# Patient Record
Sex: Male | Born: 1935 | Race: White | Hispanic: No | Marital: Married | Smoking: Never smoker
Health system: Southern US, Community
[De-identification: ages and names within clinical notes are randomized; demographics above are authoritative.]

## PROBLEM LIST (undated history)

## (undated) DIAGNOSIS — M1990 Unspecified osteoarthritis, unspecified site: Secondary | ICD-10-CM

## (undated) DIAGNOSIS — R42 Dizziness and giddiness: Secondary | ICD-10-CM

## (undated) DIAGNOSIS — I440 Atrioventricular block, first degree: Secondary | ICD-10-CM

## (undated) DIAGNOSIS — D696 Thrombocytopenia, unspecified: Secondary | ICD-10-CM

## (undated) DIAGNOSIS — E079 Disorder of thyroid, unspecified: Secondary | ICD-10-CM

## (undated) DIAGNOSIS — H9190 Unspecified hearing loss, unspecified ear: Secondary | ICD-10-CM

## (undated) DIAGNOSIS — D469 Myelodysplastic syndrome, unspecified: Secondary | ICD-10-CM

## (undated) DIAGNOSIS — F320 Major depressive disorder, single episode, mild: Secondary | ICD-10-CM

## (undated) DIAGNOSIS — K5790 Diverticulosis of intestine, part unspecified, without perforation or abscess without bleeding: Secondary | ICD-10-CM

## (undated) DIAGNOSIS — Z8719 Personal history of other diseases of the digestive system: Secondary | ICD-10-CM

## (undated) DIAGNOSIS — F32A Depression, unspecified: Secondary | ICD-10-CM

## (undated) DIAGNOSIS — A048 Other specified bacterial intestinal infections: Secondary | ICD-10-CM

## (undated) DIAGNOSIS — Z79620 Long term (current) use of immunosuppressive biologic: Secondary | ICD-10-CM

## (undated) DIAGNOSIS — L570 Actinic keratosis: Secondary | ICD-10-CM

## (undated) DIAGNOSIS — C4490 Unspecified malignant neoplasm of skin, unspecified: Secondary | ICD-10-CM

## (undated) DIAGNOSIS — Z79899 Other long term (current) drug therapy: Secondary | ICD-10-CM

## (undated) DIAGNOSIS — D649 Anemia, unspecified: Secondary | ICD-10-CM

## (undated) HISTORY — DX: Diverticulosis of intestine, part unspecified, without perforation or abscess without bleeding: K57.90

## (undated) HISTORY — DX: Unspecified hearing loss, unspecified ear: H91.90

## (undated) HISTORY — PX: APPENDECTOMY: SHX54

## (undated) HISTORY — DX: Anemia, unspecified: D64.9

## (undated) HISTORY — DX: Other specified bacterial intestinal infections: A04.8

## (undated) HISTORY — DX: Unspecified osteoarthritis, unspecified site: M19.90

## (undated) HISTORY — DX: Actinic keratosis: L57.0

## (undated) HISTORY — DX: Myelodysplastic syndrome, unspecified: D46.9

## (undated) HISTORY — PX: ANTERIOR FUSION CERVICAL SPINE: SUR626

## (undated) HISTORY — PX: KNEE ARTHROSCOPY: SHX127

## (undated) HISTORY — DX: Long term (current) use of immunosuppressive biologic: Z79.620

## (undated) HISTORY — DX: Atrioventricular block, first degree: I44.0

## (undated) HISTORY — DX: Major depressive disorder, single episode, mild: F32.0

## (undated) HISTORY — DX: Other long term (current) drug therapy: Z79.899

## (undated) HISTORY — DX: Depression, unspecified: F32.A

## (undated) HISTORY — DX: Dizziness and giddiness: R42

## (undated) HISTORY — DX: Personal history of other diseases of the digestive system: Z87.19

## (undated) HISTORY — PX: HIP FRACTURE SURGERY: SHX118

## (undated) HISTORY — DX: Disorder of thyroid, unspecified: E07.9

## (undated) HISTORY — DX: Thrombocytopenia, unspecified: D69.6

## (undated) HISTORY — DX: Unspecified malignant neoplasm of skin, unspecified: C44.90

---

## 2005-04-12 ENCOUNTER — Ambulatory Visit: Payer: Self-pay | Admitting: Family Medicine

## 2005-07-17 ENCOUNTER — Ambulatory Visit: Payer: Self-pay | Admitting: Neurology

## 2005-09-04 ENCOUNTER — Ambulatory Visit: Payer: Self-pay | Admitting: Unknown Physician Specialty

## 2005-10-05 ENCOUNTER — Other Ambulatory Visit: Payer: Self-pay

## 2005-10-19 ENCOUNTER — Inpatient Hospital Stay: Payer: Self-pay | Admitting: General Practice

## 2006-01-10 ENCOUNTER — Emergency Department: Payer: Self-pay | Admitting: Emergency Medicine

## 2006-01-15 ENCOUNTER — Emergency Department: Payer: Self-pay | Admitting: Emergency Medicine

## 2006-06-19 ENCOUNTER — Ambulatory Visit: Payer: Self-pay | Admitting: Ophthalmology

## 2006-07-31 ENCOUNTER — Ambulatory Visit: Payer: Self-pay | Admitting: Ophthalmology

## 2008-01-21 ENCOUNTER — Inpatient Hospital Stay: Payer: Self-pay | Admitting: *Deleted

## 2008-10-18 ENCOUNTER — Ambulatory Visit: Payer: Self-pay | Admitting: Family Medicine

## 2009-09-10 ENCOUNTER — Inpatient Hospital Stay: Payer: Self-pay | Admitting: Orthopedic Surgery

## 2009-10-14 ENCOUNTER — Emergency Department: Payer: Self-pay | Admitting: Emergency Medicine

## 2010-04-14 ENCOUNTER — Encounter: Payer: Self-pay | Admitting: Family Medicine

## 2010-05-11 ENCOUNTER — Encounter: Payer: Self-pay | Admitting: Family Medicine

## 2010-06-10 ENCOUNTER — Encounter: Payer: Self-pay | Admitting: Family Medicine

## 2011-02-26 ENCOUNTER — Ambulatory Visit: Payer: Self-pay | Admitting: General Practice

## 2011-03-12 ENCOUNTER — Inpatient Hospital Stay: Payer: Self-pay | Admitting: General Practice

## 2011-03-16 ENCOUNTER — Encounter: Payer: Self-pay | Admitting: Internal Medicine

## 2011-04-11 ENCOUNTER — Encounter: Payer: Self-pay | Admitting: Internal Medicine

## 2011-04-12 ENCOUNTER — Encounter: Payer: Self-pay | Admitting: Nurse Practitioner

## 2011-04-12 ENCOUNTER — Encounter: Payer: Self-pay | Admitting: Cardiothoracic Surgery

## 2011-05-12 ENCOUNTER — Encounter: Payer: Self-pay | Admitting: Nurse Practitioner

## 2011-05-12 ENCOUNTER — Encounter: Payer: Self-pay | Admitting: Cardiothoracic Surgery

## 2011-08-06 ENCOUNTER — Other Ambulatory Visit: Payer: Self-pay | Admitting: Family Medicine

## 2011-08-06 ENCOUNTER — Ambulatory Visit: Payer: Self-pay | Admitting: Family Medicine

## 2011-08-09 ENCOUNTER — Other Ambulatory Visit: Payer: Self-pay | Admitting: General Surgery

## 2012-01-29 ENCOUNTER — Ambulatory Visit: Payer: Self-pay | Admitting: Family Medicine

## 2012-06-04 ENCOUNTER — Emergency Department: Payer: Self-pay

## 2012-06-04 LAB — URINALYSIS, COMPLETE
Bacteria: NONE SEEN
Bilirubin,UR: NEGATIVE
Blood: NEGATIVE
Glucose,UR: NEGATIVE mg/dL (ref 0–75)
Ketone: NEGATIVE
Nitrite: NEGATIVE
Protein: NEGATIVE
RBC,UR: <1 /HPF (ref 0–5)
WBC UR: NONE SEEN /HPF (ref 0–5)

## 2012-06-04 LAB — TROPONIN I: Troponin-I: 0.03 ng/mL

## 2012-06-04 LAB — COMPREHENSIVE METABOLIC PANEL
Albumin: 3.5 g/dL (ref 3.4–5.0)
Alkaline Phosphatase: 187 U/L — ABNORMAL HIGH (ref 50–136)
Anion Gap: 8 (ref 7–16)
BUN: 14 mg/dL (ref 7–18)
Calcium, Total: 9.4 mg/dL (ref 8.5–10.1)
Glucose: 99 mg/dL (ref 65–99)
Osmolality: 287 (ref 275–301)
Potassium: 4.5 mmol/L (ref 3.5–5.1)
SGOT(AST): 35 U/L (ref 15–37)
SGPT (ALT): 42 U/L (ref 12–78)

## 2012-06-04 LAB — CBC
HCT: 40.6 % (ref 40.0–52.0)
MCHC: 34 g/dL (ref 32.0–36.0)
MCV: 102 fL — ABNORMAL HIGH (ref 80–100)
Platelet: 84 10*3/uL — ABNORMAL LOW (ref 150–440)
RDW: 14.2 % (ref 11.5–14.5)

## 2013-08-21 ENCOUNTER — Ambulatory Visit: Payer: Self-pay | Admitting: Podiatry

## 2013-08-27 ENCOUNTER — Encounter: Payer: Self-pay | Admitting: Podiatry

## 2013-08-28 ENCOUNTER — Encounter: Payer: Self-pay | Admitting: Podiatry

## 2013-08-28 ENCOUNTER — Ambulatory Visit (INDEPENDENT_AMBULATORY_CARE_PROVIDER_SITE_OTHER): Payer: Medicare Other | Admitting: Podiatry

## 2013-08-28 VITALS — BP 142/75 | HR 74 | Resp 18

## 2013-08-28 DIAGNOSIS — B351 Tinea unguium: Secondary | ICD-10-CM

## 2013-08-28 DIAGNOSIS — L84 Corns and callosities: Secondary | ICD-10-CM

## 2013-08-28 DIAGNOSIS — M79609 Pain in unspecified limb: Secondary | ICD-10-CM

## 2013-08-28 NOTE — Progress Notes (Signed)
Subjective:     Patient ID: Willie Mccarthy, male   DOB: 1922/07/07, 79 y.o.   MRN: [id number]  HPI presents with caregiver stating my nails are awful and I have a corn on my third toe right foot the nails are very painful   Review of Systems     Objective:   Physical Exam Neurovascular status intact with no health history changes in a noted thick painful nail bed 1-5 both feet and keratotic lesion third right    Assessment:     Chronic nail disease with pain 1-5 both feet and keratotic lesion third right    Plan:     Debridement of painful nails and lesion right foot with no bleeding

## 2013-08-28 NOTE — Progress Notes (Signed)
   Subjective:    Patient ID: Willie Mccarthy, male    DOB: [birth year], 79 y.o.   MRN: [id number]  HPI Comments: Trim toenails      Review of Systems     Objective:   Physical Exam        Assessment & Plan:

## 2013-09-17 ENCOUNTER — Ambulatory Visit: Payer: Self-pay | Admitting: Cardiovascular Disease

## 2013-09-23 IMAGING — CR DG CHEST 2V
1 series · 4 of 4 positions shown · non-contrast
Comparison: none

REASON FOR EXAM: left chest wall pain painful respirations
COMMENTS:

[Series 1: pa · 0.17mm/px · 4 of 4 slices shown]
[im 1/4]
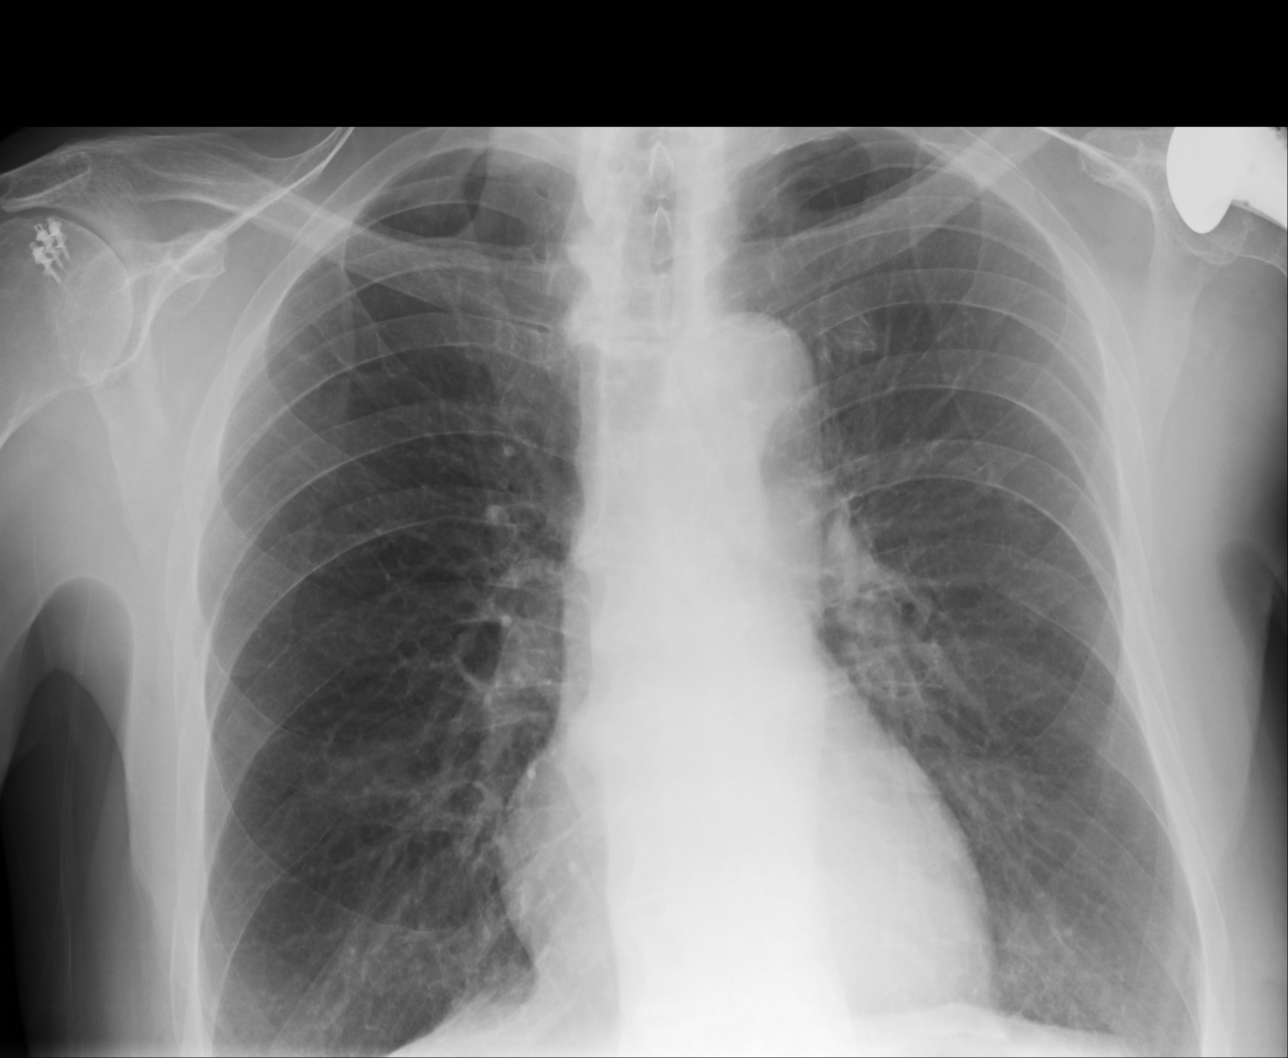
[im 2/4]
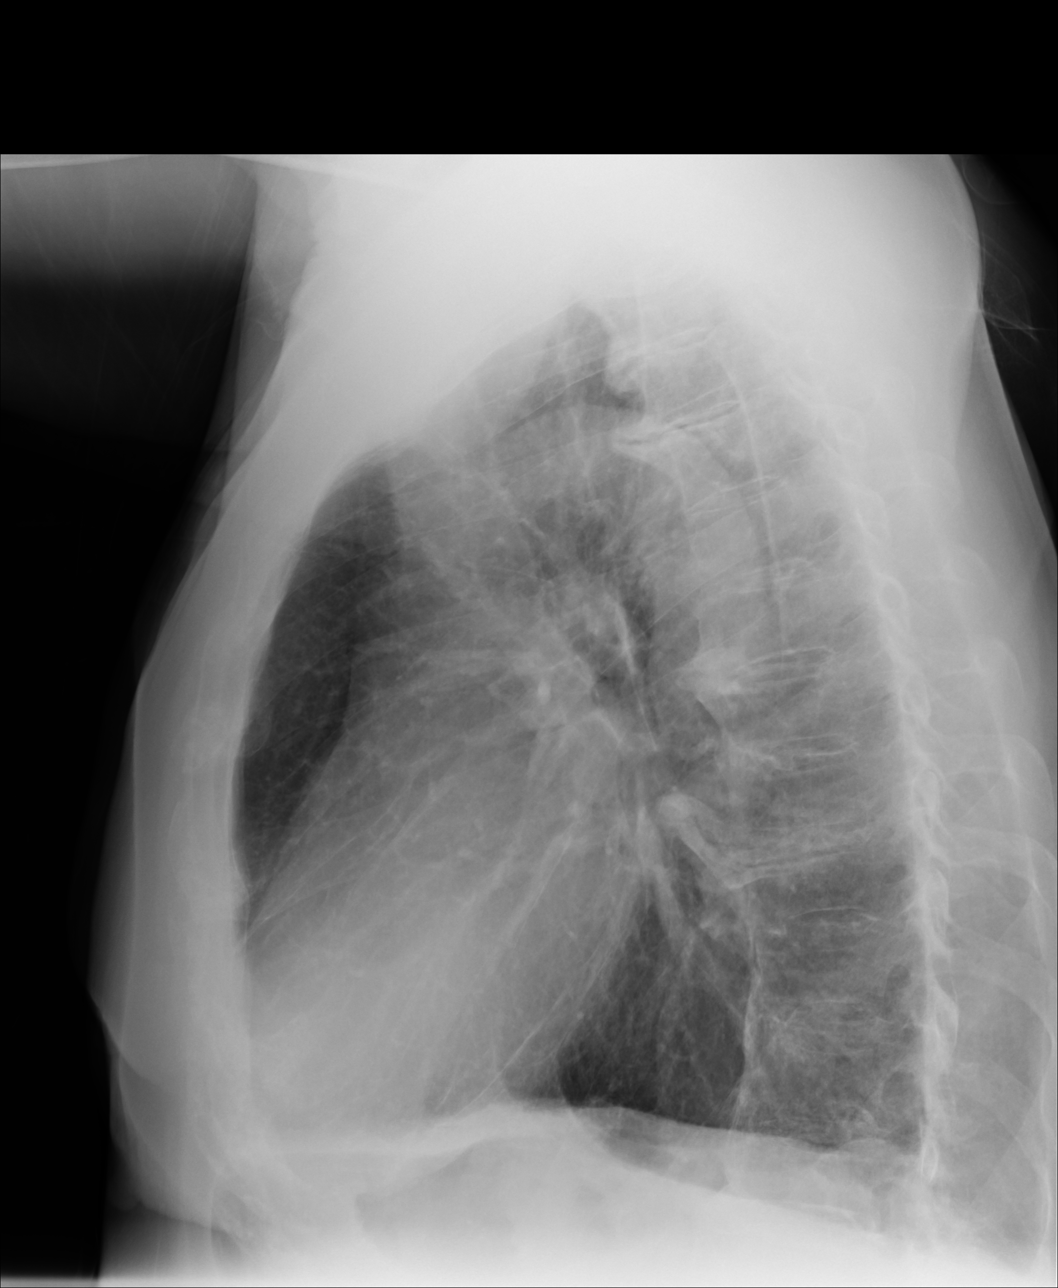
[im 3/4]
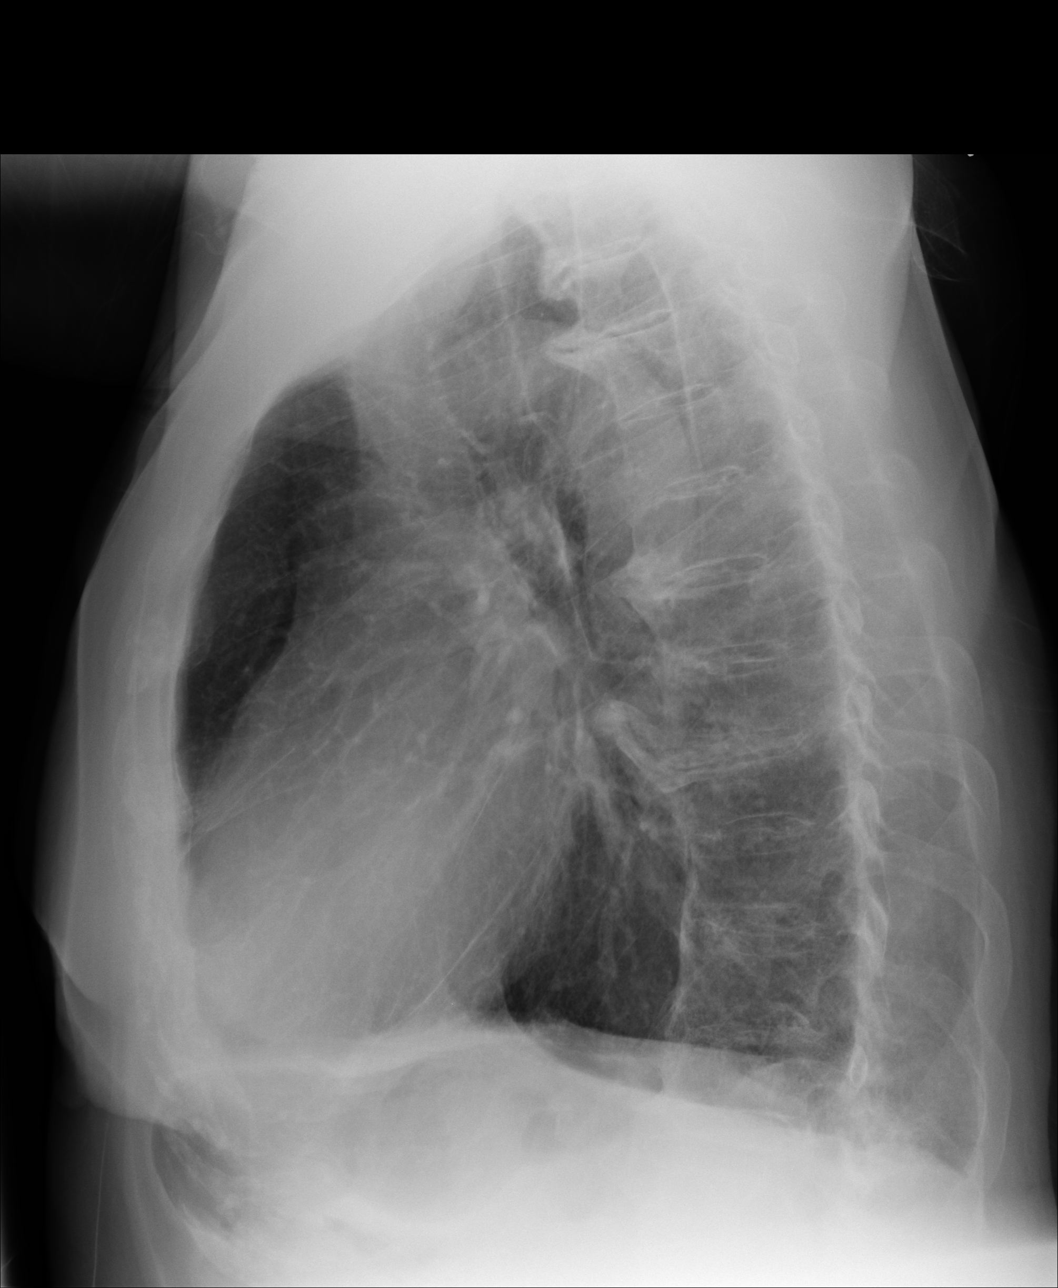
[im 4/4]
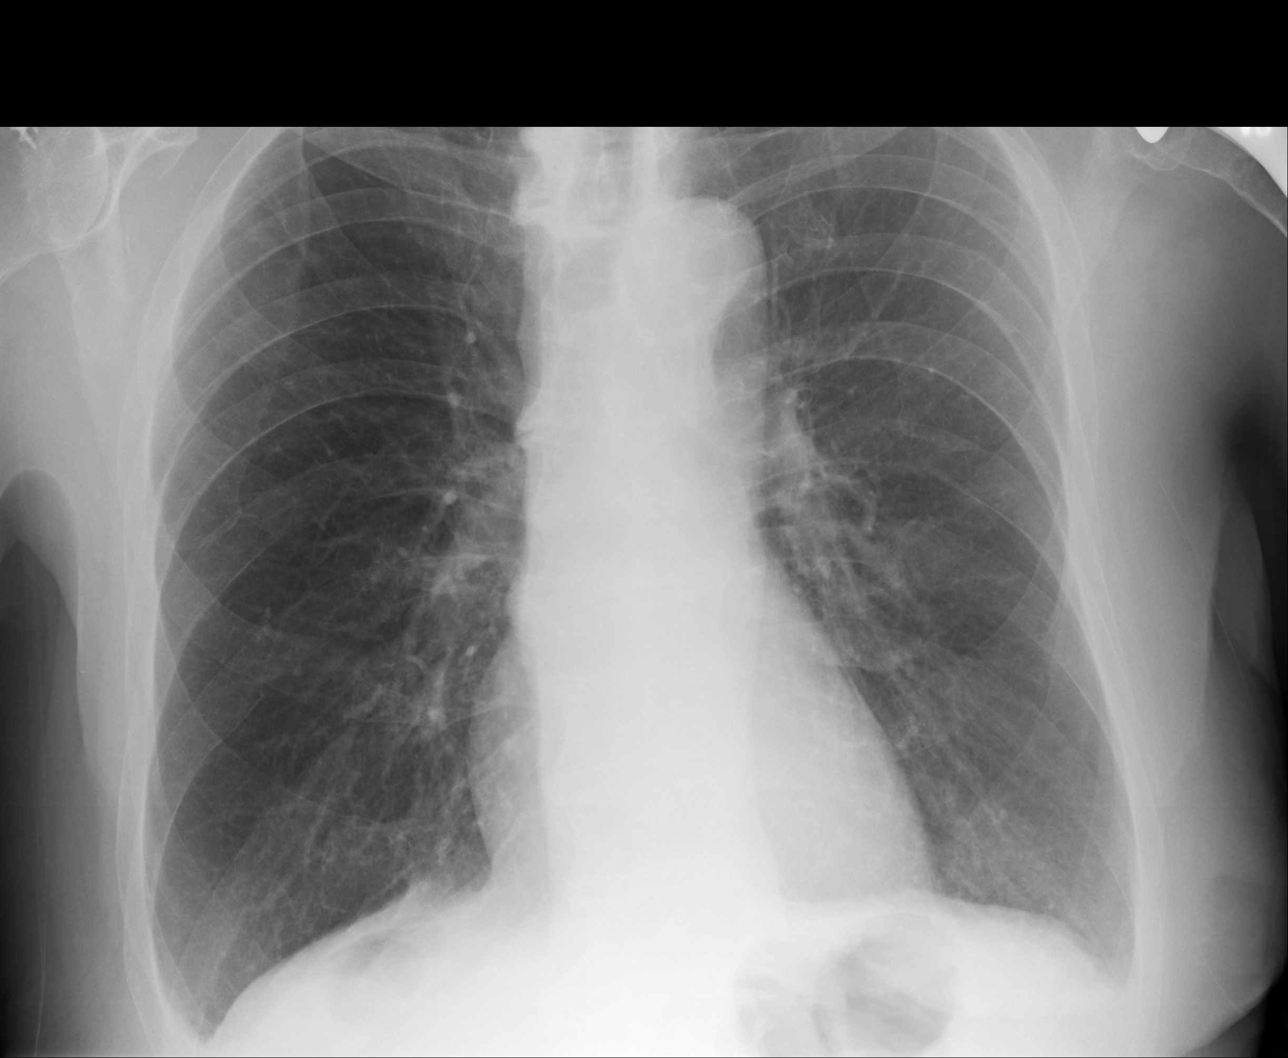

[4 of 4 positions shown; findings below may reference images not displayed]

PROCEDURE:     KDR - KDXR CHEST PA (OR AP) AND LAT  - January 29, 2012  [DATE]

RESULT:     Degenerative changes are seen in the spine. The lungs are
hyperinflated but appear to be clear of infiltrate. There is no edema,
effusion or pneumothorax. There is no discrete mass. There does not appear
to be significant interval change compared to the study 26 February, 2011.
IMPRESSION: 1. COPD.
2. Bony degenerative disease. No acute cardiopulmonary disease evident.

[REDACTED]

## 2013-09-23 IMAGING — CR DG RIBS 2V*L*
1 series · 4 of 4 positions shown · non-contrast
Comparison: none

REASON FOR EXAM: left chest wall pain painful respirations
COMMENTS:

PROCEDURE:     KDR - KDXR RIBS LEFT UNILATERAL  - January 29, 2012  [DATE]
RESULT:     Images of the left ribs demonstrate what appears to be fracture
in the anterior to anterolateral left second rib. No other additional bony
abnormality is seen other than osteopenia.

[Series 1: pa · 0.17mm/px · 4 of 4 slices shown]
[im 1/4]
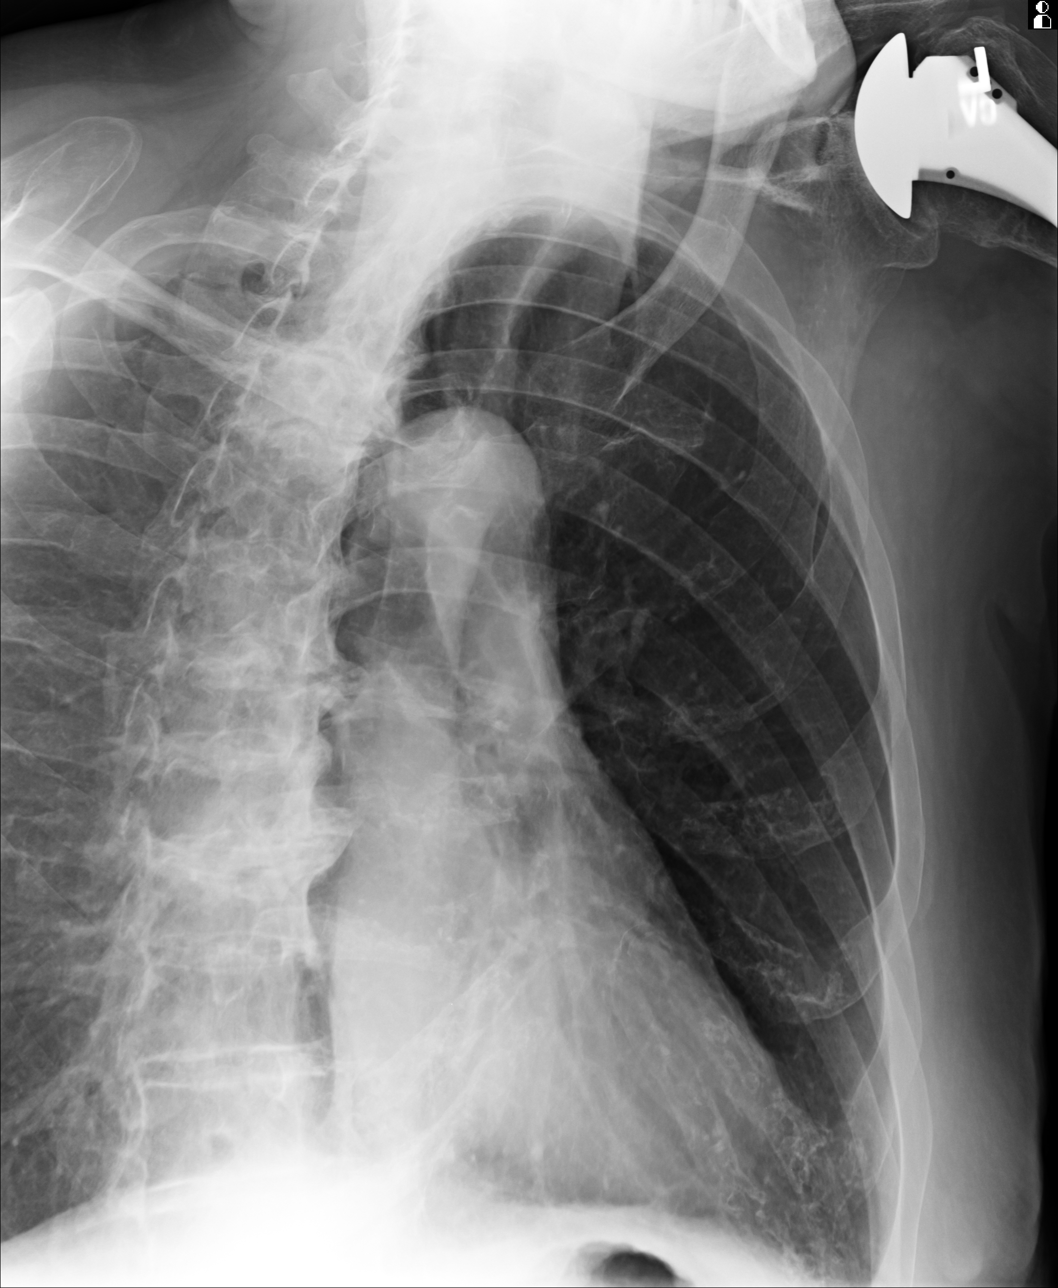
[im 2/4]
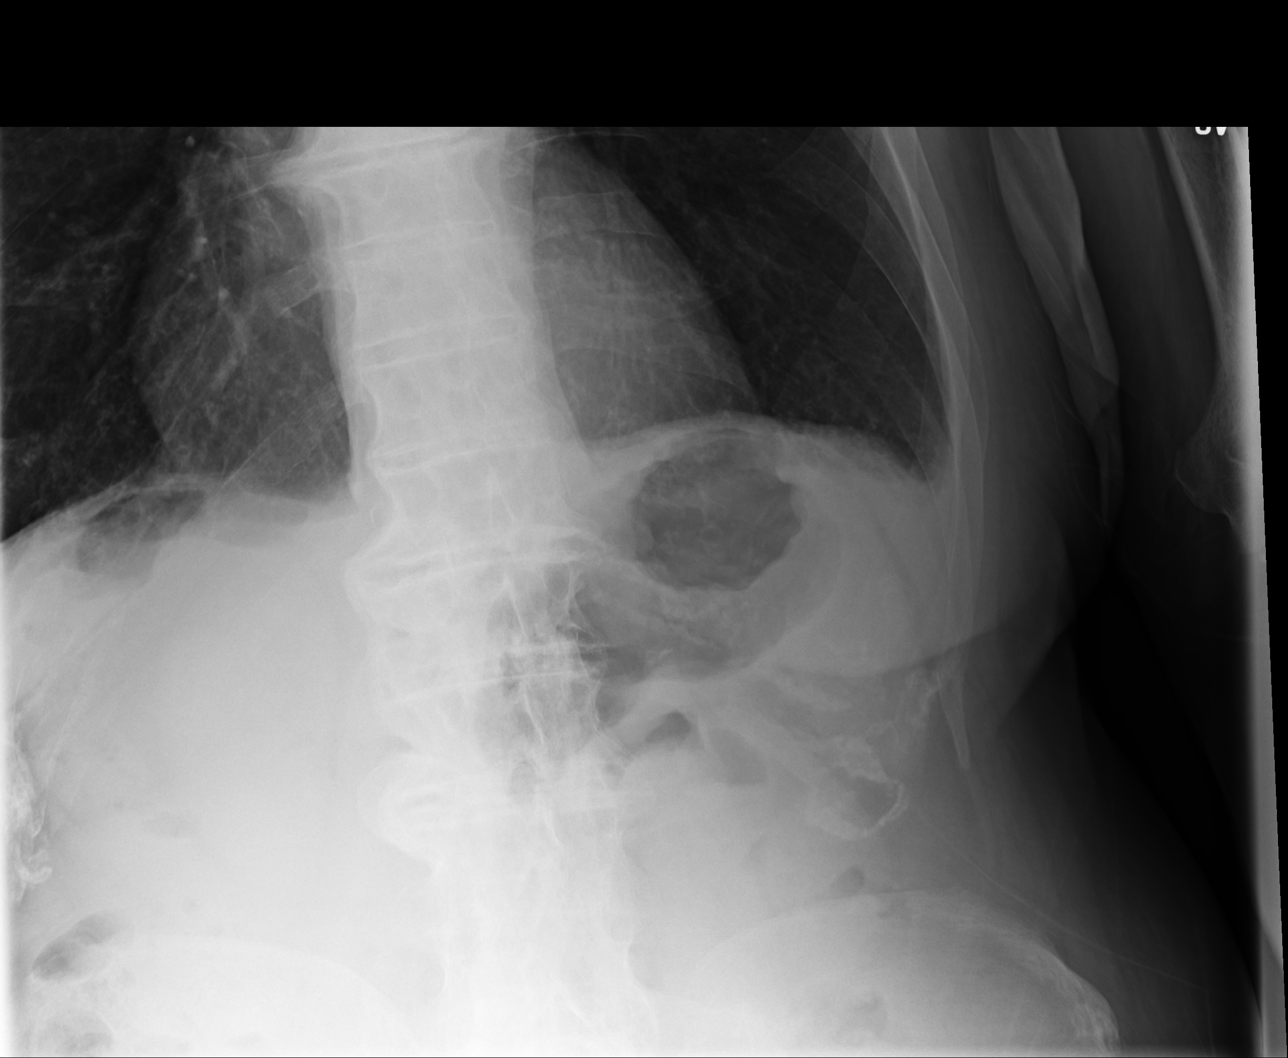
[im 3/4]
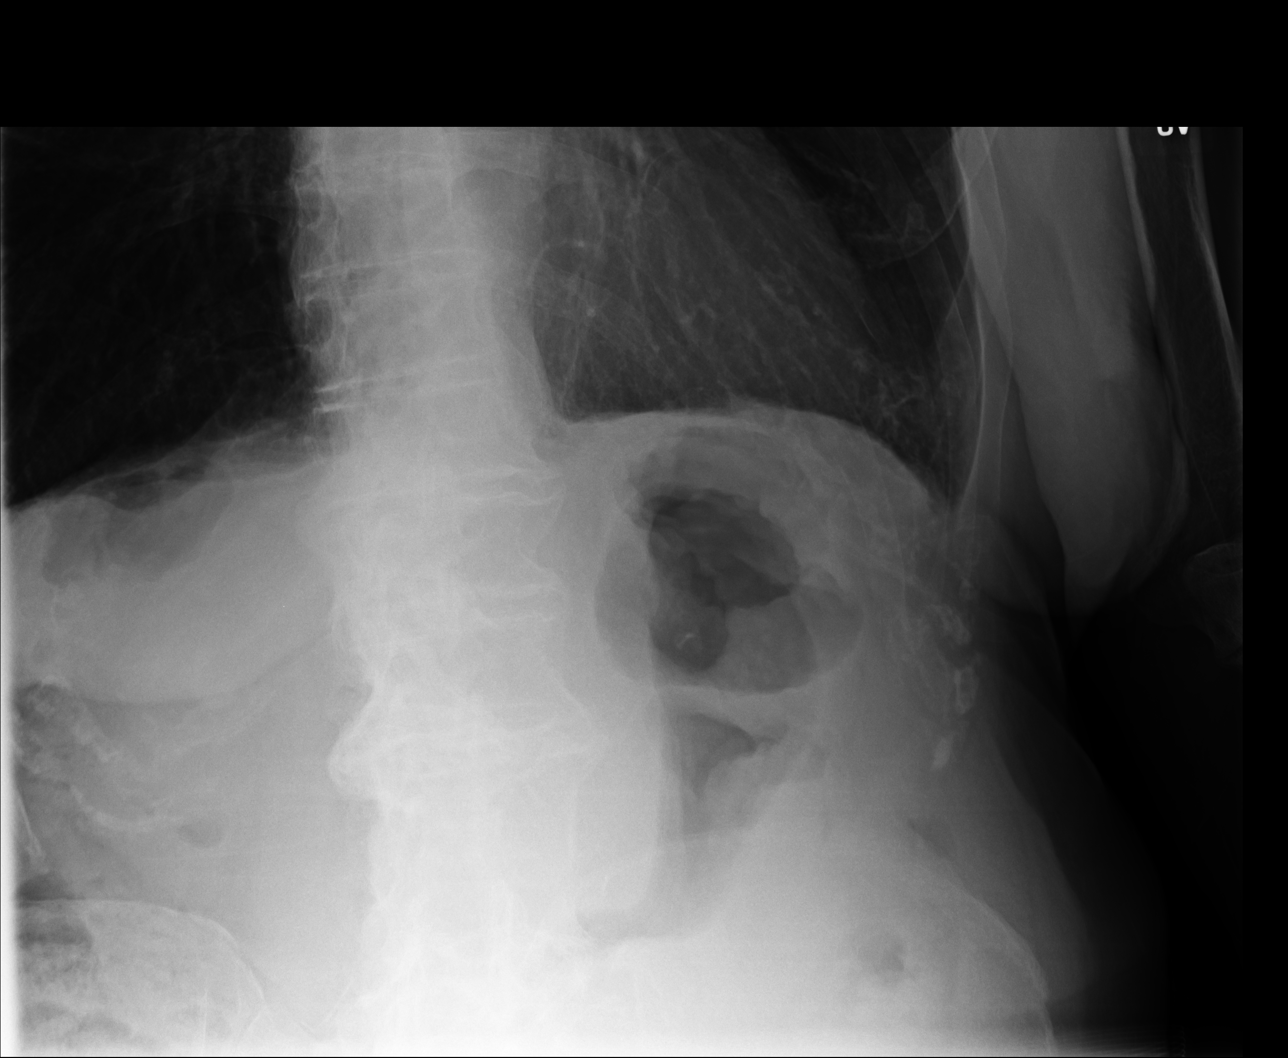
[im 4/4]
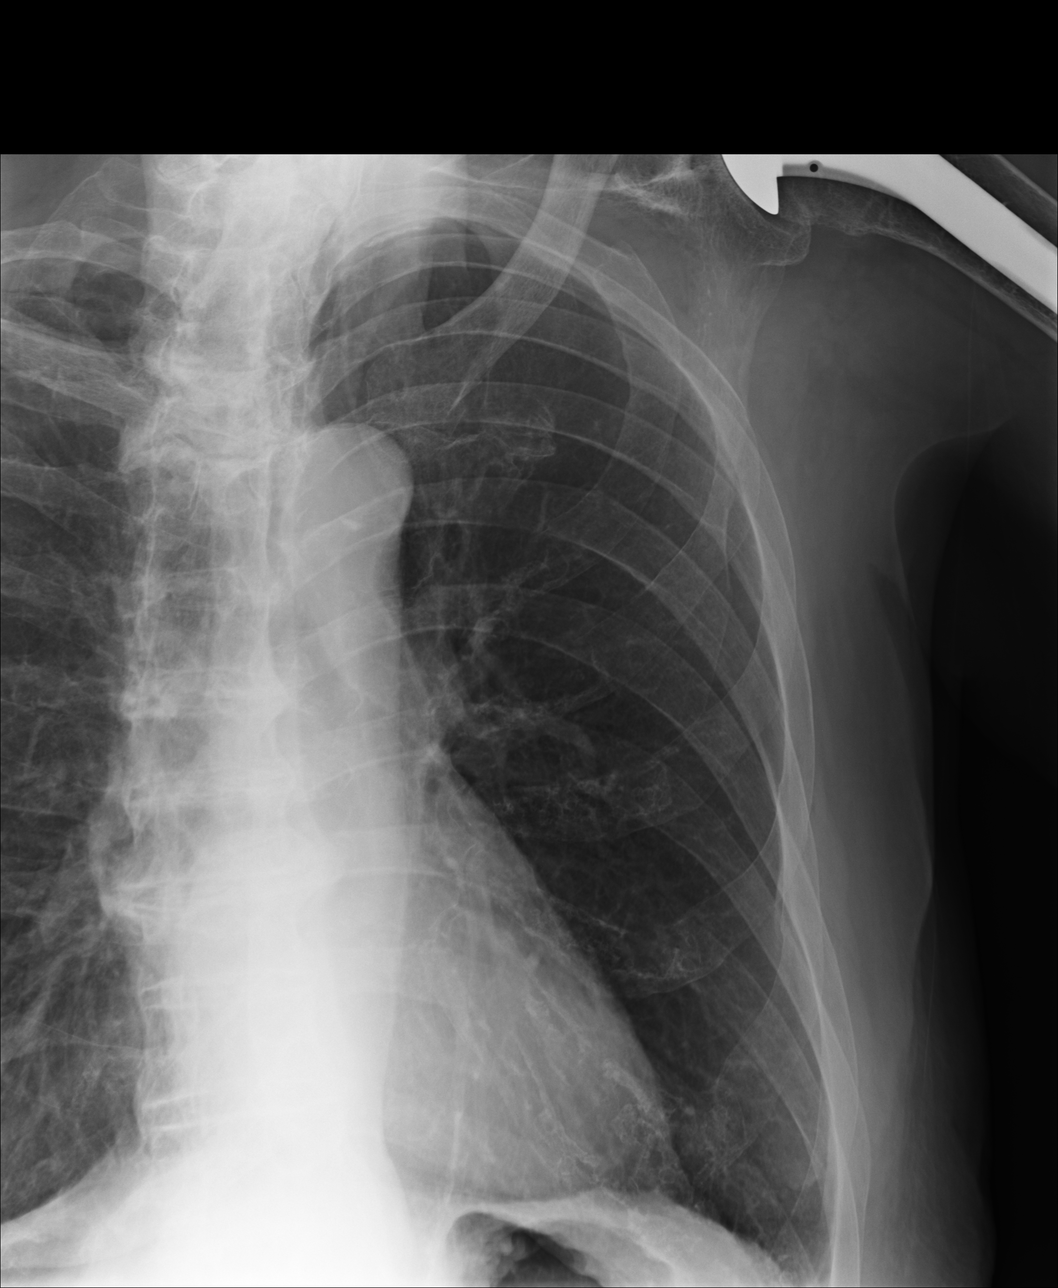

[4 of 4 positions shown; findings below may reference images not displayed]

IMPRESSION: Apparent isolated left second rib fracture. Correlate
clinically.

[REDACTED]

## 2013-09-24 ENCOUNTER — Ambulatory Visit: Payer: Self-pay | Admitting: Cardiovascular Disease

## 2013-10-02 ENCOUNTER — Inpatient Hospital Stay: Payer: Self-pay | Admitting: Internal Medicine

## 2013-10-02 LAB — URINALYSIS, COMPLETE
BILIRUBIN, UR: NEGATIVE
Bacteria: NONE SEEN
Blood: NEGATIVE
GLUCOSE, UR: NEGATIVE mg/dL (ref 0–75)
Ketone: NEGATIVE
LEUKOCYTE ESTERASE: NEGATIVE
NITRITE: NEGATIVE
Ph: 6 (ref 4.5–8.0)
Protein: NEGATIVE
RBC,UR: 1 /HPF (ref 0–5)
Specific Gravity: 1.01 (ref 1.003–1.030)
Squamous Epithelial: NONE SEEN

## 2013-10-02 LAB — BASIC METABOLIC PANEL
Anion Gap: 6 — ABNORMAL LOW (ref 7–16)
BUN: 56 mg/dL — ABNORMAL HIGH (ref 7–18)
CALCIUM: 9.5 mg/dL (ref 8.5–10.1)
CHLORIDE: 99 mmol/L (ref 98–107)
CO2: 37 mmol/L — AB (ref 21–32)
Creatinine: 1.58 mg/dL — ABNORMAL HIGH (ref 0.60–1.30)
EGFR (African American): 44 — ABNORMAL LOW
GFR CALC NON AF AMER: 38 — AB
GLUCOSE: 93 mg/dL (ref 65–99)
OSMOLALITY: 298 (ref 275–301)
POTASSIUM: 2.9 mmol/L — AB (ref 3.5–5.1)
SODIUM: 142 mmol/L (ref 136–145)

## 2013-10-02 LAB — CBC
HCT: 44.2 % (ref 40.0–52.0)
HGB: 14.7 g/dL (ref 13.0–18.0)
MCH: 33.5 pg (ref 26.0–34.0)
MCHC: 33.3 g/dL (ref 32.0–36.0)
MCV: 101 fL — AB (ref 80–100)
Platelet: 111 10*3/uL — ABNORMAL LOW (ref 150–440)
RBC: 4.39 10*6/uL — AB (ref 4.40–5.90)
RDW: 14.5 % (ref 11.5–14.5)
WBC: 7.6 10*3/uL (ref 3.8–10.6)

## 2013-10-02 LAB — TROPONIN I: Troponin-I: 0.03 ng/mL

## 2013-10-02 LAB — PRO B NATRIURETIC PEPTIDE: B-Type Natriuretic Peptide: 601 pg/mL — ABNORMAL HIGH (ref 0–450)

## 2013-10-03 LAB — BASIC METABOLIC PANEL
Anion Gap: 4 — ABNORMAL LOW (ref 7–16)
BUN: 50 mg/dL — ABNORMAL HIGH (ref 7–18)
CALCIUM: 8.7 mg/dL (ref 8.5–10.1)
Chloride: 104 mmol/L (ref 98–107)
Co2: 35 mmol/L — ABNORMAL HIGH (ref 21–32)
Creatinine: 1.32 mg/dL — ABNORMAL HIGH (ref 0.60–1.30)
EGFR (African American): 55 — ABNORMAL LOW
GFR CALC NON AF AMER: 47 — AB
Glucose: 87 mg/dL (ref 65–99)
OSMOLALITY: 298 (ref 275–301)
POTASSIUM: 3 mmol/L — AB (ref 3.5–5.1)
SODIUM: 143 mmol/L (ref 136–145)

## 2013-10-03 LAB — TSH: Thyroid Stimulating Horm: 1.98 u[IU]/mL

## 2013-10-03 LAB — MAGNESIUM: Magnesium: 2.3 mg/dL

## 2013-10-03 LAB — POTASSIUM: Potassium: 3.7 mmol/L (ref 3.5–5.1)

## 2013-10-04 ENCOUNTER — Ambulatory Visit: Payer: Self-pay | Admitting: Orthopedic Surgery

## 2013-10-04 LAB — CBC WITH DIFFERENTIAL/PLATELET
Basophil #: 0 10*3/uL (ref 0.0–0.1)
Basophil %: 0.4 %
Eosinophil #: 0.1 10*3/uL (ref 0.0–0.7)
Eosinophil %: 1.2 %
HCT: 36.7 % — ABNORMAL LOW (ref 40.0–52.0)
HGB: 12.2 g/dL — ABNORMAL LOW (ref 13.0–18.0)
LYMPHS ABS: 0.9 10*3/uL — AB (ref 1.0–3.6)
Lymphocyte %: 13.6 %
MCH: 33.7 pg (ref 26.0–34.0)
MCHC: 33.2 g/dL (ref 32.0–36.0)
MCV: 102 fL — ABNORMAL HIGH (ref 80–100)
MONO ABS: 0.5 x10 3/mm (ref 0.2–1.0)
Monocyte %: 7.7 %
NEUTROS ABS: 5.3 10*3/uL (ref 1.4–6.5)
Neutrophil %: 77.1 %
PLATELETS: 88 10*3/uL — AB (ref 150–440)
RBC: 3.61 10*6/uL — ABNORMAL LOW (ref 4.40–5.90)
RDW: 14.7 % — AB (ref 11.5–14.5)
WBC: 6.9 10*3/uL (ref 3.8–10.6)

## 2013-10-04 LAB — BASIC METABOLIC PANEL
ANION GAP: 5 — AB (ref 7–16)
BUN: 41 mg/dL — ABNORMAL HIGH (ref 7–18)
CALCIUM: 8.6 mg/dL (ref 8.5–10.1)
CHLORIDE: 110 mmol/L — AB (ref 98–107)
Co2: 27 mmol/L (ref 21–32)
Creatinine: 1.17 mg/dL (ref 0.60–1.30)
GFR CALC NON AF AMER: 55 — AB
Glucose: 91 mg/dL (ref 65–99)
Osmolality: 293 (ref 275–301)
POTASSIUM: 3.9 mmol/L (ref 3.5–5.1)
Sodium: 142 mmol/L (ref 136–145)

## 2013-10-04 LAB — OCCULT BLOOD X 1 CARD TO LAB, STOOL: OCCULT BLOOD, FECES: NEGATIVE

## 2013-10-05 LAB — CREATININE, SERUM
Creatinine: 1.21 mg/dL (ref 0.60–1.30)
EGFR (Non-African Amer.): 53 — ABNORMAL LOW

## 2013-10-05 LAB — CBC WITH DIFFERENTIAL/PLATELET
BASOS ABS: 0 10*3/uL (ref 0.0–0.1)
BASOS PCT: 0.7 %
EOS PCT: 1.8 %
Eosinophil #: 0.1 10*3/uL (ref 0.0–0.7)
HCT: 36.6 % — AB (ref 40.0–52.0)
HGB: 12.1 g/dL — ABNORMAL LOW (ref 13.0–18.0)
Lymphocyte #: 0.8 10*3/uL — ABNORMAL LOW (ref 1.0–3.6)
Lymphocyte %: 18.9 %
MCH: 33.3 pg (ref 26.0–34.0)
MCHC: 33 g/dL (ref 32.0–36.0)
MCV: 101 fL — ABNORMAL HIGH (ref 80–100)
Monocyte #: 0.4 x10 3/mm (ref 0.2–1.0)
Monocyte %: 9.9 %
NEUTROS ABS: 2.9 10*3/uL (ref 1.4–6.5)
Neutrophil %: 68.7 %
Platelet: 81 10*3/uL — ABNORMAL LOW (ref 150–440)
RBC: 3.63 10*6/uL — AB (ref 4.40–5.90)
RDW: 14.3 % (ref 11.5–14.5)
WBC: 4.3 10*3/uL (ref 3.8–10.6)

## 2013-10-06 LAB — CBC WITH DIFFERENTIAL/PLATELET
BASOS ABS: 0 10*3/uL (ref 0.0–0.1)
Basophil %: 0.6 %
EOS ABS: 0.1 10*3/uL (ref 0.0–0.7)
Eosinophil %: 3 %
HCT: 33.1 % — ABNORMAL LOW (ref 40.0–52.0)
HGB: 11.2 g/dL — ABNORMAL LOW (ref 13.0–18.0)
LYMPHS ABS: 0.8 10*3/uL — AB (ref 1.0–3.6)
Lymphocyte %: 22.4 %
MCH: 34.3 pg — ABNORMAL HIGH (ref 26.0–34.0)
MCHC: 34 g/dL (ref 32.0–36.0)
MCV: 101 fL — AB (ref 80–100)
MONO ABS: 0.4 x10 3/mm (ref 0.2–1.0)
MONOS PCT: 10.3 %
NEUTROS ABS: 2.3 10*3/uL (ref 1.4–6.5)
NEUTROS PCT: 63.7 %
Platelet: 76 10*3/uL — ABNORMAL LOW (ref 150–440)
RBC: 3.27 10*6/uL — ABNORMAL LOW (ref 4.40–5.90)
RDW: 14.4 % (ref 11.5–14.5)
WBC: 3.6 10*3/uL — ABNORMAL LOW (ref 3.8–10.6)

## 2013-10-08 ENCOUNTER — Ambulatory Visit: Payer: Self-pay | Admitting: Cardiovascular Disease

## 2013-11-08 DEATH — deceased

## 2013-11-12 ENCOUNTER — Ambulatory Visit: Payer: Self-pay | Admitting: Cardiovascular Disease

## 2013-11-20 ENCOUNTER — Ambulatory Visit: Payer: Medicare Other | Admitting: Podiatry

## 2014-12-28 NOTE — Consult Note (Signed)
PATIENT NAME:  Willie Mccarthy, Willie Mccarthy MR#:  [id number] DATE OF BIRTH:  03-13-1922  DATE OF CONSULTATION:  06/05/2012  REFERRING PHYSICIAN:   CONSULTING PHYSICIAN:  Harrell Gave A. Kaydenn Mclear, MD  REASON FOR CONSULTATION: Epigastric pain, chronic times one year, worsening over the last two days.   HISTORY OF PRESENT ILLNESS: Mr. Hammitt is a pleasant 81 year old gentleman with a history of epigastric pain times one year. He initially said it began last November. He was seen by Dr. Jamal Collin who treated him for H. pylori which became better. He never underwent EGD at that time and intermittently has epigastric pain. He had similar pain for approximately two days when he presented today. It has since resolved. No fevers, chills, nausea, vomiting, diarrhea, headache, chest pain, shortness of breath, cough, dysuria, or hematuria.   He does report he has a history of chronic constipation, for which he takes occasional prunes and per his wife stool softeners, and when he goes a few days without bowel movements he does not feel much pain, is just bloated and not feeling well. He does take Colace for that and intermittent Valium for vertigo as well as Synthroid for hypothyroidism.  Of he does not report any recent colonoscopy or EGD. 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 I am uncertain what this is about.   PAST MEDICAL HISTORY:  1. Thrombocytopenia.  2. History of intertrochanteric pinning of a fractured hip in 2011.  3. History of left shoulder replacement.  4. Status post appendectomy approximately 20 years ago.  5. Hypothyroidism.  6. Intermittent vertigo.   MEDICATIONS:  1. Synthroid 25 micrograms p.o. daily.  2. Colace, one cap t.i.d.  3. Valium 2 mg t.i.d.    SOCIAL HISTORY: Denies tobacco and alcohol, lives with his wife, does require a walker since his hip surgery in 2011 by Dr. Rudene Christians.   FAMILY HISTORY: Denies  any family history of any diabetes or high blood pressure.   PRIMARY CARE PHYSICIAN:  Dr. Rosanna Randy   REVIEW OF SYSTEMS: A total 12-point review of systems  was obtained and pertinent positives and negatives are in the history of present illness.   PHYSICAL EXAM:  VITAL SIGNS: Temperature 97.8,  pulse 65, blood pressure 169/87, respirations 20, 96% on room air.   GENERAL: No acute distress, alert and oriented times three.   HEAD: Normocephalic, atraumatic.   EYES: No scleral icterus. No conjunctivitis.   ENT: Normal external nose, normal external ear.   NECK: No obvious swelling.   LUNGS: Moving air well.  Clear to auscultation.  HEART: Regular rate and rhythm. No murmurs, rubs or gallops.   ABDOMEN: Soft, nontender, nondistended. No obvious surgical scars.   EXTREMITIES: Moves all extremities well. Strength five out of five all four extremities.   SKIN: No obvious skin lesions. No masses.  LABORATORY VALUES:  I have independently evaluated all his laboratory values. He has a white cell count of 4.2, hemoglobin and hematocrit 13.8 and 40.6. Platelets are 84. Renal panel is unremarkable. Troponins are unremarkable.   IMAGING: I independently reviewed his CT scans. Ultrasound shows gallstones. Normal gallbladder wall thickening. Normal common bile duct thickening. No pericholecystic fluid.     CT scan shows right colonic stool.  Gallbladder with gallstones. No obvious pericholecystic fluid. 1.8-cm right adrenal mass. Scattered diverticulosis without any perforation or abscess or wall thickening. Of note, gallbladder seems very posterior and I am not convinced that  he has significant right lobe of his liver and wonder if they? have atrophied with his history of primary sclerosing cholangitis per him and left lobe may have hypertrophied.   ASSESSMENT AND PLAN: Mr. Fluckiger is a pleasant 81 year old male with epigastric pain and gallstones. Differential includes pain due to constipation,  symptomatic cholelithiasis, peptic ulcer disease. No lab abnormalities or hemodynamically unstable, does not need emergent surgery.  Due to thrombocytopenia would like to treat easy to treat things not requiring surgery including constipation and peptic ulcer disease. Would recommend starting PPI to look for symptomatic relief, potentially EGD.  Would also give him laxative to treat his constipation. If we did determine gallbladder is the etiology, would consider obtaining outside hospital records and having him optimized from thrombocytopenic standpoint and get medicine clearance prior to any type of operation.   ____________________________ Glena Norfolk Armani Brar, MD cal:bjt D: 06/05/2012 00:09:37 ET T: 06/05/2012 09:30:39 ET JOB#: [id number]  cc: Harrell Gave A. Valon Glasscock, MD, <Dictator> Floyde Parkins MD ELECTRONICALLY SIGNED 06/05/2012 18:51

## 2015-01-01 NOTE — Discharge Summary (Signed)
PATIENT NAME:  Willie Mccarthy, Willie Mccarthy MR#:  [id number] DATE OF BIRTH:  [birth year]  DATE OF ADMISSION:  2013-10-18 DATE OF DISCHARGE:  10/07/2013  DISCHARGE DIAGNOSES:  1.  Right knee pain, possibly from bursitis, improving.  2.  Lower extremity swelling with negative ultrasound and Doppler, started back on the diuretic. Renal insufficiency, improved with hydration.  3.  Dehydration, resolved with IV fluids.  4.  Constipation, now resolved.  5.  Generalized weakness, likely multifactorial.  6.  Anemia of chronic disease, stable.  7.  Abdominal pain, resolved with negative CT scan of the abdomen and pelvis.  8.  Muscle rigidity - possible Parkinson disease, started on Sinemet.   SECONDARY DIAGNOSES:  1.  Osteoarthritis.  2.  Vertigo.   CONSULTATIONS:  1.  Orthopedics, Mardene Sayer, MD.  2.  Physical therapy.   DIAGNOSTIC STUDIES:  1.  Chest x-ray on the October 18, 2022 showed no acute cardiopulmonary disease.  2.  Right knee x-ray on the 24th of January showed total knee arthroplasty on the right. No acute findings.  3.  CT scan of the abdomen and pelvis without contrast on the 26th of January showed no acute findings. Small bilateral pleural effusions. Gallstone without evidence of cholecystitis.  4.  Bilateral lower extremity Doppler on the 25th of January showed no evidence of DVT.   MAJOR LABORATORY PANELS:  1.  Urinalysis on admission was negative.  2.  Hemoccult stool was negative.   HISTORY AND SHORT HOSPITAL COURSE: The patient is an 81 year old male with above-mentioned medical problems who was admitted for generalized weakness, was found to be dehydrated with acute renal failure which was improving with hydration.   The patient was also found to have right knee pain for which orthopedic consultation was obtained, who recommended knee x-ray which was obtained. Did not show any acute pathology. This was thought to be due to bursitis. The patient was improving with pain medication.    He was also noted to have a lower extremity swelling for which Doppler was performed to rule out DVT and was negative.   His diuretic has been restarted. He was found to have some muscle rigidity for which he was started on Sinemet considering possible Parkinson syndrome/disease and seemed to be helping.   He is close to his baseline with normal renal function and is being discharged to skilled nursing facility today per physical therapy recommendation.   DISCHARGE PHYSICAL EXAMINATION:  VITAL SIGNS: Temperature 97.9, heart rate 61 per minute, respirations 18 per minute, blood pressure 111/67 mmHg, saturating 96% on room air.   CARDIOVASCULAR: S1, S2 normal. No murmurs, rubs or gallop.  LUNGS: Clear to auscultation bilaterally. No wheezing, rales, or rhonchi. No crepitation.  ABDOMEN: Soft, benign.  NEUROLOGIC: Nonfocal examination.  Otherwise physical evaluation remained at baseline.   DISCHARGE MEDICATIONS:  1.  Diazepam 2 mg p.o. 3 times a day.  2.  Levothyroxine 25 mcg p.o. daily.  3.  Metolazone 2.5 mg p.o. daily.  4.  Lasix 40 mg p.o. b.i.d.  5.  Sertraline 50 mg p.o. daily.  6.  MiraLAX once daily.  7.  Tylenol 650 mg p.o. every 4 hours as needed.   DISCHARGE DIET: Low sodium.   DISCHARGE ACTIVITY: As tolerated.   DISCHARGE INSTRUCTIONS AND FOLLOWUP: The patient was instructed to follow up with his primary care physician, Dr. Miguel Aschoff, in 1 to 2 weeks. He will need followup with [facility] neurology doctor in 2 to 4 weeks and with Dr.  Kathlyn Sacramento in 4 to 6 weeks. He will get physical therapy evaluation and management while at the facility.  TOTAL TIME SPENT: 45 minutes.   ____________________________ Lucina Mellow. Manuella Ghazi, MD vss:np D: 10/07/2013 16:15:14 ET T: 10/07/2013 17:19:24 ET JOB#: [id number]  cc: Jedediah Noda S. Manuella Ghazi, MD, <Dictator> [name], MD [facility] Neurology Muhammad A. Fletcher Anon, [place] MD ELECTRONICALLY SIGNED 10/08/2013 21:35

## 2015-01-01 NOTE — Consult Note (Signed)
PATIENT NAME:  Willie Mccarthy, Willie Mccarthy MR#:  [id number] DATE OF BIRTH:  11/28/1921  DATE OF CONSULTATION:  10/04/2013  REQUESTING PHYSICIAN:  Monica Becton, MD  CONSULTING PHYSICIAN:  Mardene Sayer, MD  CHIEF COMPLAINT: Right knee pain.    HISTORY: The patient is a 81 year old male who is admitted to the medical service due to acute renal failure and dehydration. He has had increasing falls lately and had difficulty walking. He reports that he fell one month ago into some bushes. He has been complaining of right knee pain. His wife relays that he has been having increasing difficulty walking, however she feels that it is due to his overall global weakness and not his knee pain per se.  The patient reports he has had several orthopedic surgeries including bilateral knee replacements, right hip surgery, and a left shoulder replacement. He has also had multiple spinal surgeries, the details of which he is uncertain about.   PAST MEDICAL HISTORY: Thrombocytopenia, vertigo.  MEDICATIONS: Please see electronic medical record.  ALLERGIES: None.  SOCIAL HISTORY: The patient is married. He lives with his wife.  FAMILY HISTORY: Has a history of colon cancer.   REVIEW OF SYSTEMS: Positive for generalized weakness, otherwise negative for any changes in his vision, respiratory, cardiovascular, GI, GU, endocrine, hematologic, neurologic or psychiatric.   PHYSICAL EXAMINATION:  GENERAL: A thin-appearing male, supine in the bed who is overall sickly appearing. VITAL SIGNS: Stable. HEAD: Atraumatic.  CHEST: Equal and symmetric.  NECK: Supple.  ABDOMEN: Soft.  EXTREMITIES: Bilateral knees demonstrate midline anterior knee incisions. Right hip has a lateral incision. There is no swelling of either knee. There in no erythema.  He is tender over the insertion of the pes anserine on the right. On the left the knee is nontender. Bilateral knees are able to be taken through an active and passive range of motion  from 5-110 without any pain.   RADIOGRAPHS: X-rays demonstrate well-fixed right total knee arthroplasty, the distal end of intramedullary femoral nail is seen. No evidence of fracture, no evidence of hardware complications. No lucency of the cement mantle.  ASSESSMENT AND PLAN: An 81 year old male with knee pain status post fall. Clinical diagnosis is right knee strain with pes anserinis bursitis.   DISCUSSION: The physical exam is benign, and his symptoms are most likely a knee strain and strain of his pes anserine secondary to his acute fall. I see nothing on his physical exam or x-rays that would preclude physical therapy and rehabilitation. I explained the findings to the patient. He has some difficulty with comprehension, but verbalizes understanding. His wife verbalized full understanding of the diagnosis and treatment reccomendations.  The patient can be seen back in the office of my partner, Dr. Mack Guise, on an as needed basis.     ____________________________ Mardene Sayer, MD pa:sg D: 10/04/2013 10:48:27 ET T: 10/04/2013 11:51:25 ET JOB#: [id number]  cc: Mardene Sayer, MD, <Dictator> Raelyn Ensign. Stephanie Acre, MD PhD Orthopaedic Surgery ELECTRONICALLY SIGNED 10/04/2013 13:46

## 2015-01-01 NOTE — H&P (Signed)
PATIENT NAME:  Willie Mccarthy, Willie Mccarthy MR#:  [id number] DATE OF BIRTH:  03-Nov-1921  DATE OF ADMISSION:  10/02/2013  PRIMARY CARE PHYSICIAN: Dr. Miguel Aschoff.   REFERRING PHYSICIAN: Dr. Lisa Roca.   CHIEF COMPLAINT: Generalized weakness.   HISTORY OF PRESENT ILLNESS: Willie Mccarthy is an 81 year old pleasant white male with past medical history of osteoarthritis, lower extremity swelling, vertigo, has been on chronic diazepam, has been experiencing severe generalized weakness, has been falling down with increased sleep. The patient was unable to walk. The patient usually walks with the help of a walker; however, the patient could not pull himself off of the bed. His speech has been significantly become softer. The patient has significantly decreased p.o. and fluid intake.  Concerning this, the patient is brought to the Emergency Department.  Workup in the Emergency Department, the patient is found to elevated BUN and creatinine, as well as hypokalemia with a potassium of 2.9. No obvious signs of infection are found, including urine and the chest x-ray. The patient has normal white blood cell count. The patient was initiated on IV fluids.   PAST MEDICAL HISTORY: 1.  Osteoarthritis.  2.  Right hip replacement.  3.  Biatrial knee replacement.  4.  Shoulder replacement.  5.  C-spine fusion.  6.  Back surgery.  7.  Appendectomy.  8.  History of thrombocytopenia. 9.  Bilateral cataract surgery.  10.  History of vertigo.   ALLERGIES: No known drug allergies.   HOME MEDICATIONS:  1.   50 mg daily.  2.  MiraLAX daily.  3.  Metolazone 2.5 mg once a day.  4.  Levothyroxine 25 mcg once a day.  5.  Lasix 40 mg 2 times a day.  6.  Diazepam 2 mg 3 times a day.   SOCIAL HISTORY: No history of smoking, drinking alcohol or using illicit drugs. Married, lives with his wife.   FAMILY HISTORY: Has history of colon cancer.   REVIEW OF SYSTEMS: CONSTITUTIONAL: Generalized weakness.  EYES: No change in  vision.  ENT: No change in hearing. No sore throat.  RESPIRATORY: Mild cough with no productive sputum.  CARDIOVASCULAR: No chest pain, palpitations. GASTROINTESTINAL: Has no nausea, vomiting, abdominal pain.  GENITOURINARY: Has no dysuria or hematuria.  ENDOCRINE: No polyuria or polydipsia. HEMATOLOGIC: No easy bruising or bleeding.  MUSCULOSKELETAL: Osteoarthritis.  NEUROLOGIC: Has generalized weakness, no focal weakness.   PHYSICAL EXAMINATION: GENERAL: This is a well-built chronically ill-looking male, lying down in the bed, not in distress.  VITAL SIGNS: Temperature 97.9, pulse 65, blood pressure 127/60, respiratory rate of 16, oxygen saturation 97% on room air.  HEENT: Head normocephalic, atraumatic. There is no scleral icterus. Conjunctivae normal. Pupils equal and react to light. Extraocular movements are intact. Mucous membranes dry. No pharyngeal erythema.  NECK: Supple. No lymphadenopathy. No JVD. No carotid bruit.  CHEST: No focal tenderness.  LUNGS: Bilaterally clear to auscultation.  HEART: S1 and S2, regular. No murmurs are heard.  ABDOMEN: Bowel sounds present. Soft, nontender, nondistended. No hepatosplenomegaly.  EXTREMITIES: Trace pitting edema around the ankles.   SKIN: No rash or lesions.  MUSCULOSKELETAL: Good range of motion in all the extremities.  NEUROLOGIC:  The patient is extremely lethargic; however, oriented to place, person and time. No apparent cranial nerve abnormalities.  Motor 5/5 in upper and lower extremities.   LABORATORY DATA: Chest x-ray, one view portable: No acute cardiopulmonary disease.  Troponin 0.03. CBC completely within normal limits. BNP 601. CMP: BUN 56, creatinine of 1.58; the  rest of all the values are within normal limits.   ASSESSMENT AND PLAN: Willie Mccarthy is an 81 year old male who comes to the Emergency Department with severe dehydration, generalized weakness.  1. Dehydration, secondary to overdiuresis. Continue with IV fluids. No  obvious signs of infection are found. We will continue to follow up with BMP.  2.  Hypokalemia: Will replace by mouth, as well as through IV.  3.  Acute insufficiency secondary to dehydration. Continue with IV fluids and follow up.  4.  Severe debility: This could be a combination of factors, dehydration,  as well as patient taking diazepam 3 times a day further causing him to be more somnolent. We will keep the patient on diazepam only at night. Continue with physical therapy, occupational therapy.  5.  Hyperthyroidism. I will check the TSH level.  6.  Keep the patient on deep vein thrombosis prophylaxis with Lovenox.   TIME SPENT: 50 minutes.     ____________________________ Monica Becton, MD pv:NTS D: 10/03/2013 00:32:54 ET T: 10/03/2013 01:52:06 ET JOB#: [id number]  cc: Monica Becton, MD, <Dictator> [name], MD Grier Mitts Riyanshi Wahab MD ELECTRONICALLY SIGNED 10/17/2013 20:46

## 2015-05-31 IMAGING — CT CT ABD-PELV W/O CM
2 of 4 series · 17 of 46 positions shown, 19 images · non-contrast
Comparison: CT abdomen and pelvis 06/04/2012.

CLINICAL DATA: Less than abdominal and pelvic pain.

EXAM:
CT ABDOMEN AND PELVIS WITHOUT CONTRAST
TECHNIQUE: Multidetector CT imaging of the abdomen and pelvis was performed
following the standard protocol without intravenous contrast.

[Series 2: routine abd pel without · axial · non-contrast · 0.83mm/px · z∈[-1366,-961]mm · 14 of 89 slices shown, 16 images]
[im 4/89  soft-tissue]
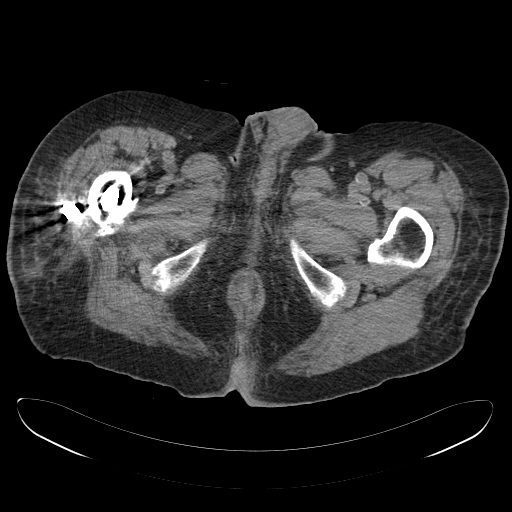
[im 4/89  bone]
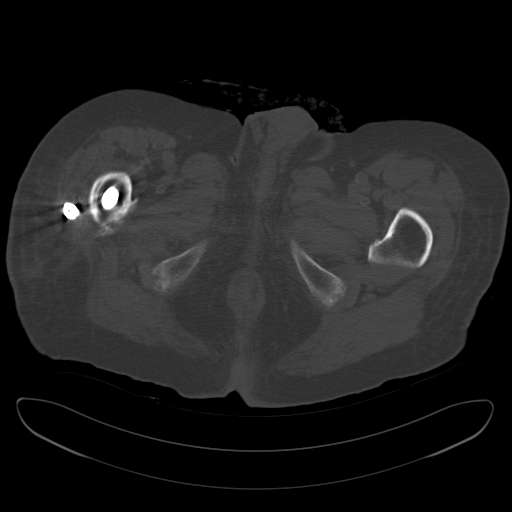
[im 11/89  soft-tissue]
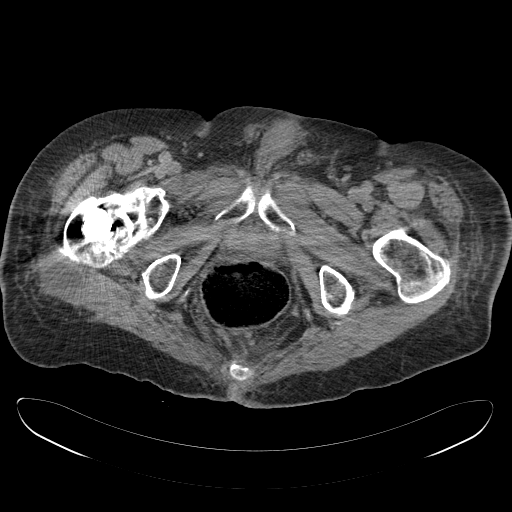
[im 17/89  soft-tissue]
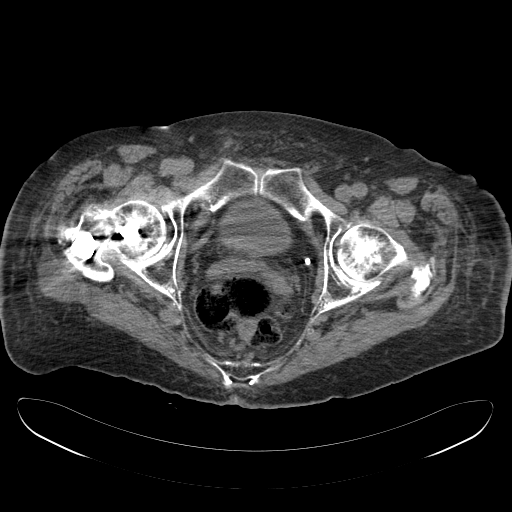
[im 24/89  soft-tissue]
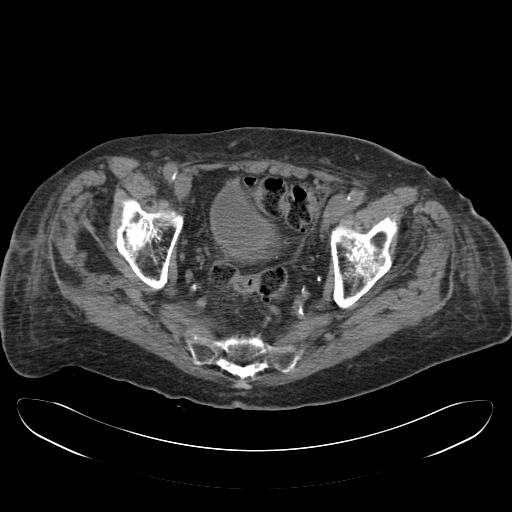
[im 31/89  soft-tissue]
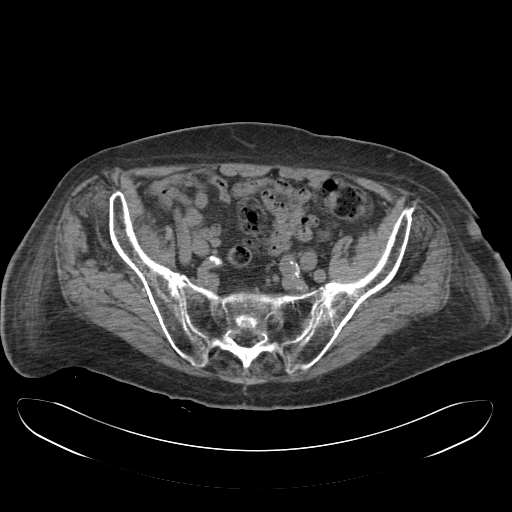
[im 34/89  soft-tissue]
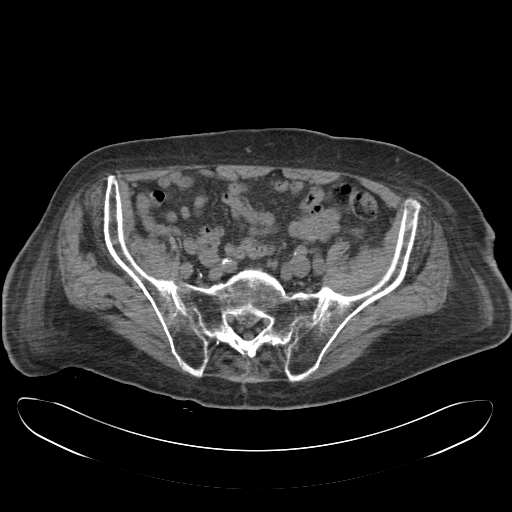
[im 41/89  soft-tissue]
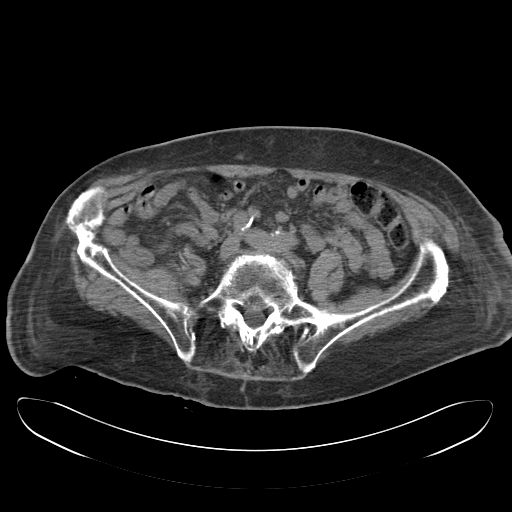
[im 48/89  soft-tissue]
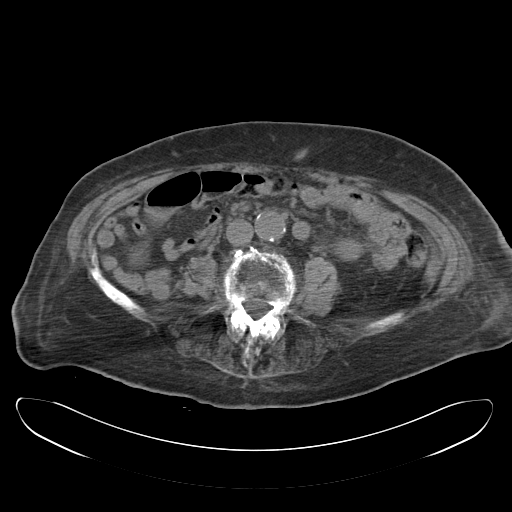
[im 55/89  soft-tissue]
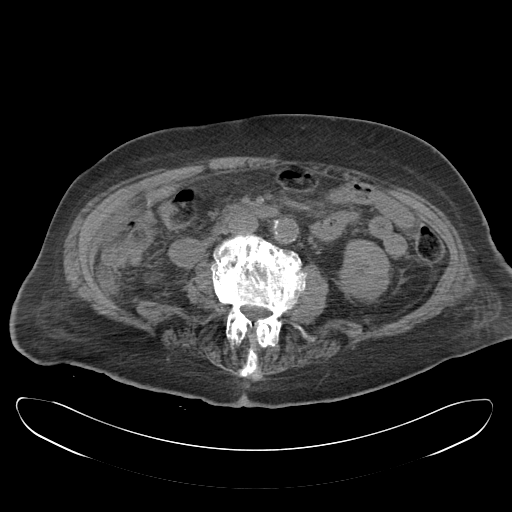
[im 55/89  bone]
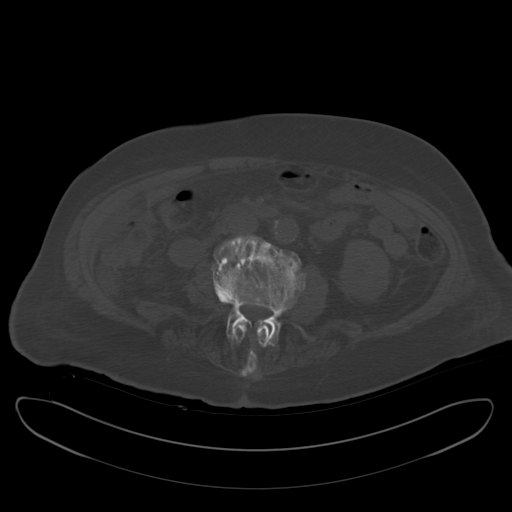
[im 58/89  soft-tissue]
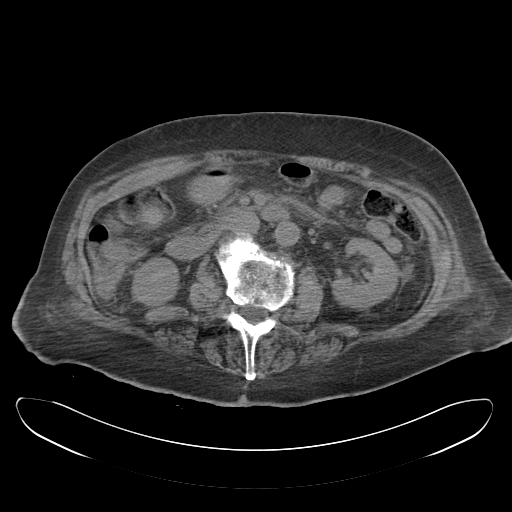
[im 65/89  soft-tissue]
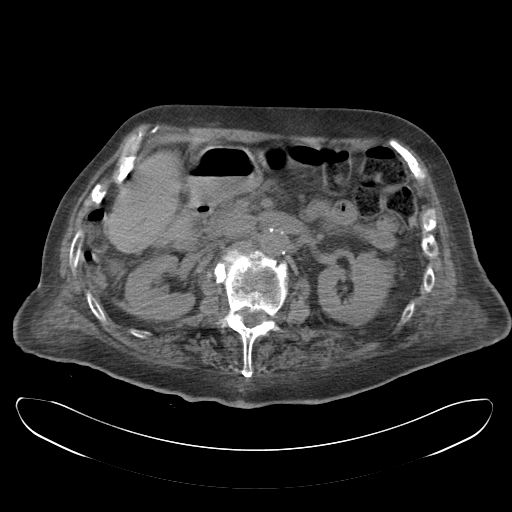
[im 72/89  soft-tissue]
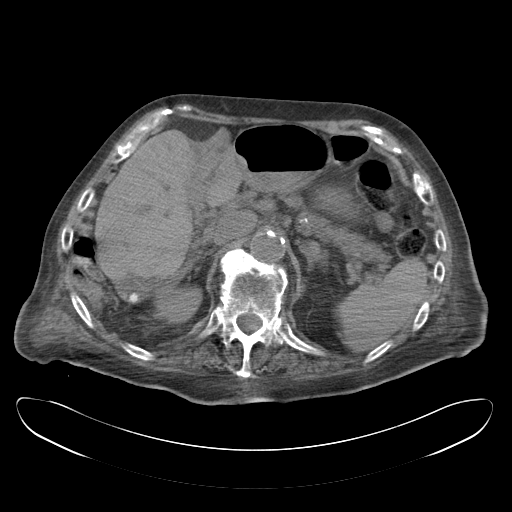
[im 78/89  soft-tissue]
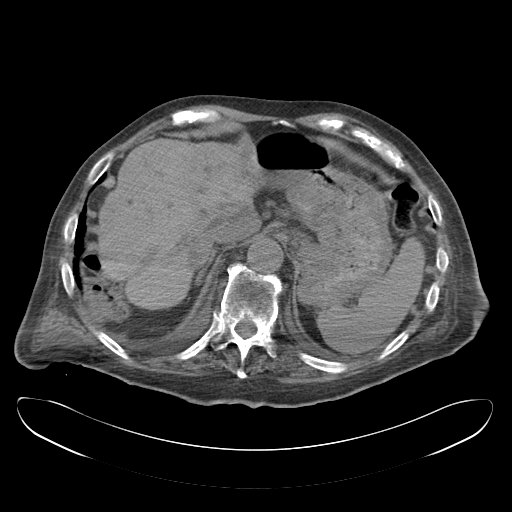
[im 85/89  soft-tissue]
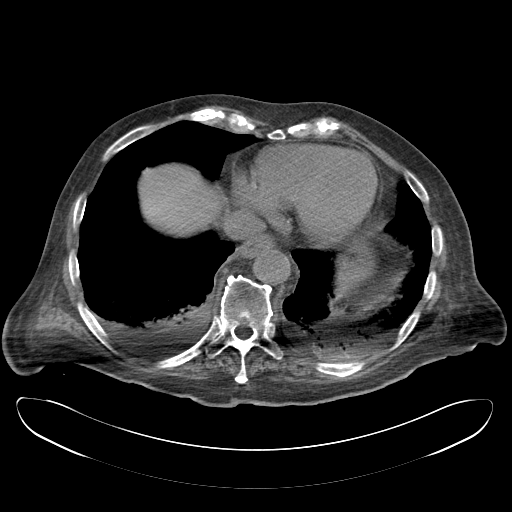

[Series 5: cor routine abd pel wo · coronal · 0.83mm/px · 3 of 128 slices shown]
[im 43/128  soft-tissue]
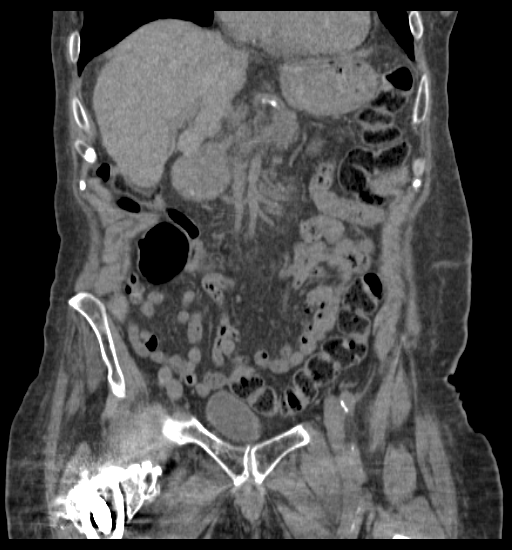
[im 57/128  soft-tissue]
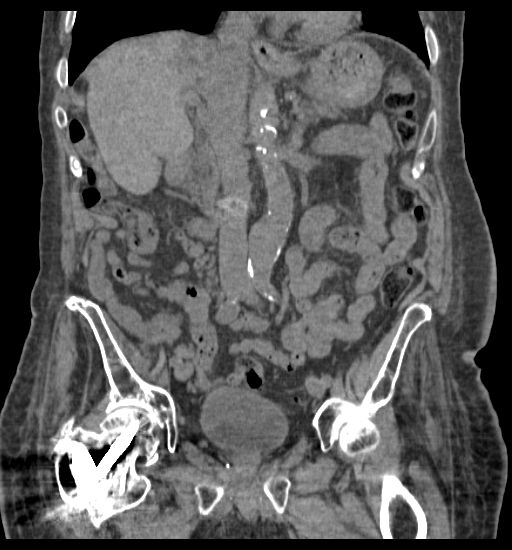
[im 71/128  soft-tissue]
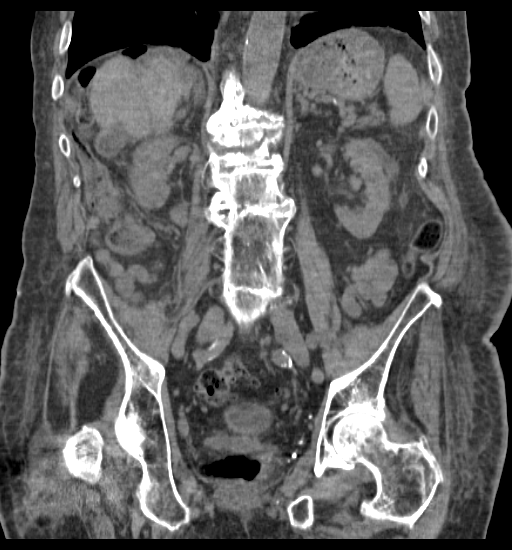

[17 of 46 positions shown; findings below may reference images not displayed]

FINDINGS: There are small bilateral pleural effusions with associated basilar
atelectasis. No pericardial effusion is identified.

Small stones are again seen within the gallbladder without evidence
of cholecystitis. There is unchanged intrahepatic biliary ductal
dilatation. No focal liver lesion is identified. The spleen and
adrenal glands appear normal. The pancreas is atrophic without focal
lesion identified. Cyst in the upper pole the right kidney is
unchanged. The kidneys are otherwise unremarkable. Mild prominence
of the descending abdominal aorta at 2.8 cm is unchanged. There is
no hemorrhage. The stomach and small and large bowel demonstrate no
acute or focal abnormality with diverticulosis noted. Colonic
interposition posterior to the right hepatic lobe is unchanged.
There is no lymphadenopathy or fluid. No lytic or sclerotic bony
lesion is identified. Fixation of a remote healed right
intertrochanteric fracture is identified. Multilevel spondylosis is
noted.
IMPRESSION: No acute finding in the abdomen or pelvis.

Small bilateral pleural effusions.

Gallstones without evidence of cholecystitis.
